# Patient Record
Sex: Female | Born: 1995 | Race: White | Hispanic: No | Marital: Single | Smoking: Never smoker
Health system: Southern US, Community
[De-identification: ages and names within clinical notes are randomized; demographics above are authoritative.]

## PROBLEM LIST (undated history)

## (undated) DIAGNOSIS — K219 Gastro-esophageal reflux disease without esophagitis: Secondary | ICD-10-CM

## (undated) DIAGNOSIS — J302 Other seasonal allergic rhinitis: Secondary | ICD-10-CM

## (undated) HISTORY — PX: NO PAST SURGERIES: SHX2092

---

## 2013-04-26 ENCOUNTER — Encounter (HOSPITAL_COMMUNITY): Payer: Self-pay | Admitting: Emergency Medicine

## 2013-04-26 ENCOUNTER — Inpatient Hospital Stay (HOSPITAL_COMMUNITY)
Admission: EM | Admit: 2013-04-26 | Discharge: 2013-04-29 | DRG: 690 | Disposition: A | Discharge status: Home / Self Care | Payer: BC Managed Care – PPO | Attending: Internal Medicine | Admitting: Internal Medicine

## 2013-04-26 ENCOUNTER — Emergency Department (HOSPITAL_COMMUNITY): Payer: BC Managed Care – PPO

## 2013-04-26 DIAGNOSIS — R6883 Chills (without fever): Secondary | ICD-10-CM | POA: Diagnosis present

## 2013-04-26 DIAGNOSIS — N12 Tubulo-interstitial nephritis, not specified as acute or chronic: Secondary | ICD-10-CM | POA: Insufficient documentation

## 2013-04-26 DIAGNOSIS — R509 Fever, unspecified: Secondary | ICD-10-CM | POA: Diagnosis present

## 2013-04-26 DIAGNOSIS — R112 Nausea with vomiting, unspecified: Secondary | ICD-10-CM | POA: Diagnosis present

## 2013-04-26 DIAGNOSIS — E876 Hypokalemia: Secondary | ICD-10-CM | POA: Diagnosis present

## 2013-04-26 DIAGNOSIS — R739 Hyperglycemia, unspecified: Secondary | ICD-10-CM | POA: Diagnosis present

## 2013-04-26 DIAGNOSIS — R1115 Cyclical vomiting syndrome unrelated to migraine: Secondary | ICD-10-CM | POA: Diagnosis present

## 2013-04-26 DIAGNOSIS — R7309 Other abnormal glucose: Secondary | ICD-10-CM

## 2013-04-26 DIAGNOSIS — R109 Unspecified abdominal pain: Secondary | ICD-10-CM | POA: Diagnosis present

## 2013-04-26 DIAGNOSIS — N10 Acute tubulo-interstitial nephritis: Principal | ICD-10-CM | POA: Diagnosis present

## 2013-04-26 DIAGNOSIS — K219 Gastro-esophageal reflux disease without esophagitis: Secondary | ICD-10-CM | POA: Diagnosis present

## 2013-04-26 HISTORY — DX: Other seasonal allergic rhinitis: J30.2

## 2013-04-26 HISTORY — DX: Gastro-esophageal reflux disease without esophagitis: K21.9

## 2013-04-26 LAB — COMPREHENSIVE METABOLIC PANEL
ALT: 12 U/L (ref 0–35)
AST: 12 U/L (ref 0–37)
Albumin: 3.5 g/dL (ref 3.5–5.2)
Alkaline Phosphatase: 65 U/L (ref 39–117)
BUN: 16 mg/dL (ref 6–23)
CO2: 21 mEq/L (ref 19–32)
Calcium: 9.7 mg/dL (ref 8.4–10.5)
Chloride: 99 mEq/L (ref 96–112)
Creatinine, Ser: 1.03 mg/dL (ref 0.50–1.10)
GFR calc Af Amer: >90 mL/min (ref >90)
GFR calc non Af Amer: 78 mL/min — ABNORMAL LOW (ref >90)
Glucose, Bld: 127 mg/dL — ABNORMAL HIGH (ref 70–99)
Potassium: 3.5 mEq/L — ABNORMAL LOW (ref 3.7–5.3)
Sodium: 134 mEq/L — ABNORMAL LOW (ref 137–147)
Total Bilirubin: 0.7 mg/dL (ref 0.3–1.2)
Total Protein: 7.6 g/dL (ref 6.0–8.3)

## 2013-04-26 LAB — URINE MICROSCOPIC-ADD ON

## 2013-04-26 LAB — URINALYSIS, ROUTINE W REFLEX MICROSCOPIC
Glucose, UA: NEGATIVE mg/dL
Hgb urine dipstick: NEGATIVE
Ketones, ur: 15 mg/dL — AB
Nitrite: NEGATIVE
Protein, ur: 100 mg/dL — AB
Specific Gravity, Urine: 1.029 (ref 1.005–1.030)
Urobilinogen, UA: 2 mg/dL — ABNORMAL HIGH (ref 0.0–1.0)
pH: 6 (ref 5.0–8.0)

## 2013-04-26 LAB — CBC WITH DIFFERENTIAL/PLATELET
Basophils Absolute: 0 10*3/uL (ref 0.0–0.1)
Basophils Relative: 0 % (ref 0–1)
Eosinophils Absolute: 0 10*3/uL (ref 0.0–0.7)
Eosinophils Relative: 0 % (ref 0–5)
HCT: 36 % (ref 36.0–46.0)
Hemoglobin: 12 g/dL (ref 12.0–15.0)
Lymphocytes Relative: 7 % — ABNORMAL LOW (ref 12–46)
Lymphs Abs: 2.2 10*3/uL (ref 0.7–4.0)
MCH: 28.8 pg (ref 26.0–34.0)
MCHC: 33.3 g/dL (ref 30.0–36.0)
MCV: 86.3 fL (ref 78.0–100.0)
Monocytes Absolute: 3.1 10*3/uL — ABNORMAL HIGH (ref 0.1–1.0)
Monocytes Relative: 10 % (ref 3–12)
Neutro Abs: 25.8 10*3/uL — ABNORMAL HIGH (ref 1.7–7.7)
Neutrophils Relative %: 83 % — ABNORMAL HIGH (ref 43–77)
Platelets: 261 10*3/uL (ref 150–400)
RBC: 4.17 MIL/uL (ref 3.87–5.11)
RDW: 14 % (ref 11.5–15.5)
WBC: 31.1 10*3/uL — ABNORMAL HIGH (ref 4.0–10.5)

## 2013-04-26 LAB — LIPASE, BLOOD: Lipase: 12 U/L (ref 11–59)

## 2013-04-26 LAB — POC URINE PREG, ED: Preg Test, Ur: NEGATIVE

## 2013-04-26 MED ORDER — ACETAMINOPHEN 325 MG PO TABS
650.0000 mg | ORAL_TABLET | Freq: Four times a day (QID) | ORAL | Status: DC | PRN
  Administered 2013-04-27: 650 mg via ORAL
  Filled 2013-04-26: qty 2

## 2013-04-26 MED ORDER — SODIUM CHLORIDE 0.9 % IV BOLUS (SEPSIS)
1000.0000 mL | INTRAVENOUS | Status: AC
  Administered 2013-04-26: 1000 mL via INTRAVENOUS

## 2013-04-26 MED ORDER — CEFTRIAXONE SODIUM 1 G IJ SOLR
1.0000 g | INTRAMUSCULAR | Status: DC
  Administered 2013-04-27 – 2013-04-28 (×2): 1 g via INTRAVENOUS
  Filled 2013-04-26 (×2): qty 10

## 2013-04-26 MED ORDER — IOHEXOL 300 MG/ML  SOLN
50.0000 mL | Freq: Once | INTRAMUSCULAR | Status: AC | PRN
  Administered 2013-04-26: 50 mL via ORAL

## 2013-04-26 MED ORDER — ACETAMINOPHEN 650 MG RE SUPP: 650.0000 mg | Freq: Four times a day (QID) | RECTAL | Status: DC | PRN

## 2013-04-26 MED ORDER — MORPHINE SULFATE 4 MG/ML IJ SOLN
4.0000 mg | Freq: Once | INTRAMUSCULAR | Status: AC
  Administered 2013-04-26: 4 mg via INTRAVENOUS

## 2013-04-26 MED ORDER — ONDANSETRON HCL 4 MG/2ML IJ SOLN
4.0000 mg | Freq: Four times a day (QID) | INTRAMUSCULAR | Status: DC
  Administered 2013-04-26 – 2013-04-28 (×4): 4 mg via INTRAVENOUS
  Filled 2013-04-26 (×4): qty 2

## 2013-04-26 MED ORDER — PROMETHAZINE HCL 25 MG/ML IJ SOLN: 12.5000 mg | Freq: Four times a day (QID) | INTRAMUSCULAR | Status: DC | PRN

## 2013-04-26 MED ORDER — PANTOPRAZOLE SODIUM 40 MG IV SOLR
40.0000 mg | INTRAVENOUS | Status: DC
  Administered 2013-04-26: 40 mg via INTRAVENOUS
  Filled 2013-04-26 (×2): qty 40

## 2013-04-26 MED ORDER — DEXTROSE 5 % IV SOLN
1.0000 g | Freq: Once | INTRAVENOUS | Status: AC
  Administered 2013-04-26: 1 g via INTRAVENOUS
  Filled 2013-04-26: qty 10

## 2013-04-26 MED ORDER — POTASSIUM CHLORIDE IN NACL 40-0.9 MEQ/L-% IV SOLN
INTRAVENOUS | Status: DC
  Administered 2013-04-26 – 2013-04-29 (×3): via INTRAVENOUS
  Filled 2013-04-26 (×7): qty 1000

## 2013-04-26 MED ORDER — ONDANSETRON HCL 4 MG PO TABS
4.0000 mg | ORAL_TABLET | Freq: Four times a day (QID) | ORAL | Status: DC
  Administered 2013-04-27 – 2013-04-29 (×5): 4 mg via ORAL
  Filled 2013-04-26 (×16): qty 1

## 2013-04-26 MED ORDER — MORPHINE SULFATE 4 MG/ML IJ SOLN
4.0000 mg | INTRAMUSCULAR | Status: DC | PRN
  Administered 2013-04-26: 4 mg via INTRAVENOUS
  Filled 2013-04-26: qty 1

## 2013-04-26 MED ORDER — ONDANSETRON HCL 4 MG/2ML IJ SOLN
4.0000 mg | Freq: Once | INTRAMUSCULAR | Status: AC
  Administered 2013-04-26: 4 mg via INTRAVENOUS
  Filled 2013-04-26: qty 2

## 2013-04-26 MED ORDER — MORPHINE SULFATE 4 MG/ML IJ SOLN
4.0000 mg | Freq: Once | INTRAMUSCULAR | Status: AC
  Administered 2013-04-26: 4 mg via INTRAVENOUS
  Filled 2013-04-26: qty 1

## 2013-04-26 MED ORDER — IOHEXOL 300 MG/ML  SOLN
100.0000 mL | Freq: Once | INTRAMUSCULAR | Status: AC | PRN
  Administered 2013-04-26: 100 mL via INTRAVENOUS

## 2013-04-26 NOTE — ED Provider Notes (Signed)
CSN: [id number]     Arrival date & time 04/26/13  1555 History   First MD Initiated Contact with Patient 04/26/13 1607     Chief Complaint  Patient presents with  . Emesis     (Consider location/radiation/quality/duration/timing/severity/associated sxs/prior Treatment) HPI Pt is a 17yo female [facility] due to 3 day hx of vomiting, lower abdominal pain with associated elevated WBC.  Pt states she has been unable to keep down fluids or food for past 2-3 days due to nausea and vomiting.  Pt also c/o lower abdominal pain that is constant, 8/10, aching and cramping, worse with certain movements.  Pt reports temp "above 100"  But took tylenol earlier today, 99.3 in triage.  Pt states she does have lower back pain but states it is from a "pulled muscle." Denies diarrhea, urinary or vaginal symptoms. Pt is sexually active, on birth control, states she is not concerned for pregnancy or STDs.  LMP was 3/26 but was "more spotting than normal."  Denies sick contacts or recent travel. Denies significant PMH including no previous abdominal surgeries.   Past Medical History  Diagnosis Date  . GERD (gastroesophageal reflux disease)    History reviewed. No pertinent past surgical history. No family history on file. History  Substance Use Topics  . Smoking status: Not on file  . Smokeless tobacco: Not on file  . Alcohol Use: Not on file   OB History   Grav Para Term Preterm Abortions TAB SAB Ect Mult Living                 Review of Systems  Constitutional: Positive for fever, chills, appetite change and fatigue. Negative for diaphoresis and unexpected weight change.  Gastrointestinal: Positive for nausea and vomiting. Negative for abdominal pain, diarrhea and constipation.  All other systems reviewed and are negative.      Allergies  Review of patient's allergies indicates no known allergies.  Home Medications   Current Outpatient Rx  Name  Route  Sig  Dispense  Refill   . acetaminophen (TYLENOL) 325 MG tablet   Oral   Take 650 mg by mouth every 6 (six) hours as needed (pain).         Marland Kitchen. aspirin-acetaminophen-caffeine (EXCEDRIN MIGRAINE) 250-250-65 MG per tablet   Oral   Take 1-2 tablets by mouth every 6 (six) hours as needed for headache (pain).         . cetirizine (ZYRTEC) 10 MG tablet   Oral   Take 10 mg by mouth daily.         Marland Kitchen. ibuprofen (ADVIL,MOTRIN) 200 MG tablet   Oral   Take 400 mg by mouth every 6 (six) hours as needed (pain).         . naproxen (NAPROSYN) 250 MG tablet   Oral   Take 500 mg by mouth 2 (two) times daily as needed (pain).         . norgestimate-ethinyl estradiol (SPRINTEC 28) 0.25-35 MG-MCG tablet   Oral   Take 1 tablet by mouth daily.          BP 109/61  Pulse 85  Temp(Src) 99.3 F (37.4 C) (Oral)  Resp 12  Ht 5\' 2"  (1.575 m)  Wt 132 lb 12.8 oz (60.238 kg)  BMI 24.28 kg/m2  SpO2 99%  LMP 04/24/2013 Physical Exam  Nursing note and vitals reviewed. Constitutional: She appears well-developed and well-nourished. No distress.  HENT:  Head: Normocephalic and atraumatic.  Eyes: Conjunctivae  are normal. No scleral icterus.  Neck: Normal range of motion.  Cardiovascular: Normal rate, regular rhythm and normal heart sounds.   Pulmonary/Chest: Effort normal and breath sounds normal. No respiratory distress. She has no wheezes. She has no rales. She exhibits no tenderness.  Abdominal: Soft. Bowel sounds are normal. She exhibits no distension and no mass. There is tenderness. There is CVA tenderness ( bilateral CVAT vs muscularge ). There is no rebound and no guarding.  Soft, non-distended, lower abdominal pain, L>R. No rebound, guarding or masses.  Genitourinary: Vagina normal.  Chaperoned exam. Normal external exam. Speculum: no vaginal discharge, erythema or lesions. Bimanual: No CMT, adnexal tenderness or masses.  Musculoskeletal: Normal range of motion.  Neurological: She is alert.  Skin: Skin is warm  and dry. She is not diaphoretic.    ED Course  Procedures (including critical care time) Labs Review Labs Reviewed  CBC WITH DIFFERENTIAL - Abnormal; Notable for the following:    WBC 31.1 (*)    Neutrophils Relative % 83 (*)    Lymphocytes Relative 7 (*)    Neutro Abs 25.8 (*)    Monocytes Absolute 3.1 (*)    All other components within normal limits  COMPREHENSIVE METABOLIC PANEL - Abnormal; Notable for the following:    Sodium 134 (*)    Potassium 3.5 (*)    Glucose, Bld 127 (*)    GFR calc non Af Amer 78 (*)    All other components within normal limits  URINALYSIS, ROUTINE W REFLEX MICROSCOPIC - Abnormal; Notable for the following:    Color, Urine AMBER (*)    APPearance CLOUDY (*)    Bilirubin Urine SMALL (*)    Ketones, ur 15 (*)    Protein, ur 100 (*)    Urobilinogen, UA 2.0 (*)    Leukocytes, UA SMALL (*)    All other components within normal limits  URINE MICROSCOPIC-ADD ON - Abnormal; Notable for the following:    Squamous Epithelial / LPF FEW (*)    Bacteria, UA MANY (*)    Casts HYALINE CASTS (*)    All other components within normal limits  LIPASE, BLOOD  POC URINE PREG, ED   Imaging Review Ct Abdomen Pelvis W Contrast  04/26/2013   CLINICAL DATA:  Mid abdominal/left flank pain, leukocytosis  EXAM: CT ABDOMEN AND PELVIS WITH CONTRAST  TECHNIQUE: Multidetector CT imaging of the abdomen and pelvis was performed using the standard protocol following bolus administration of intravenous contrast.  CONTRAST:  OMNIPAQUE IOHEXOL 300 MG/ML  SOLN  COMPARISON:  None.  FINDINGS: Lung bases are clear.  Liver, spleen, pancreas, and adrenal glands are within normal limits.  Gallbladder is unremarkable. No intrahepatic or extrahepatic ductal dilatation.  Heterogeneous perfusion of the left kidney (series 2/ image 23), suggesting pyelonephritis. Right kidney is within normal limits. No hydronephrosis.  No evidence of bowel obstruction. Appendix is not discretely visualized but  there are no right lower quadrant inflammatory changes to suggest acute appendicitis.  No evidence of abdominal aortic aneurysm.  No abdominopelvic ascites.  No suspicious abdominopelvic lymphadenopathy.  Uterus and left ovary are within normal limits. No right adnexal mass.  Thick-walled bladder (series 2/ image 68).  Visualized osseous structures are within normal limits.  IMPRESSION: Thick-walled bladder, correlate for cystitis.  Associated heterogeneous perfusion of the left kidney, suggesting pyelonephritis.   Electronically Signed   By: Charline Bills M.D.   On: 04/26/2013 19:42     EKG Interpretation None  MDM   Final diagnoses:  Pyelonephritis    Pt is a 17yo healthy female sent to ED from [facility] c/o vomiting x2-3 days associated with fever, abdominal pain, and elevated WBC. Pt is otherwise healthy.  On exam-pt appears well, non-toxic. Vitals: unremarkable. Abd-soft, non-distended, tender across lower abdomen.  Pelvic: benign.  DDx: ectopic pregnancy, diverticulitis, appendicitis, vaginal infection, UTI/pyelonephritis, gastroenteritis.   Tx in ED: IV fluids, zofran, and morphine  Labs- CBC: WBC-31.1           CMP: mild hypokalemia- 3.5            Lipase: WNL             UA-evidence of UTI            Urine preg: negative  Imaging: CT abd pelvis: suggestive of pyelonephritis and cystitis.    Discussed pt with Dr. Denton Lank. Due to evidence of pyelonephritis, hx of fever, vomiting and pain, will consult hospitalist to admit pt for IV antibiotics.  IV rocephin started in ED.   8:07 PM Consulted with Dr. Sherrie Mustache, [facility] hospitalist. Pt will be admitted to med-surg. Pt is stable at this time and requesting to eat.       Junius Finner, PA-C 04/26/13 2008

## 2013-04-26 NOTE — H&P (Signed)
[facility] History and Physical  Taralynn Quiett AVW:[id number] DOB: [birth year] DOA: 04/26/2013  Referring physician: PA, Carolin Coy PCP: No primary provider on file.   Chief Complaint: Nausea and vomiting.  HPI: Cynthia Gross is a 18 y.o. female with a history of seasonal allergies and GERD, who presents with a complaint of nausea, vomiting, and lower abdominal pain. Her symptoms started 2 days ago. They have persisted and worsened. On average, she has had more than 5 episodes of emesis daily. She has been unable to keep any of her medications and solids and liquids down. She has had pain over her lower abdomen, over her bladder, which radiates to her left lower back. She describes the pain as if she had a "pulled muscle". At its worse, the pain as a 7-8/10 in intensity. She has discomfort with urination, but she denies burning with urination. She has had subjective fever and chills. She had been trying to take Tylenol for aches and fever, but had vomited it back up. She denies coffee grounds emesis, blood in her emesis, blood in her stools, or diarrhea. She presented to [facility] earlier today because of her persistent symptoms. Apparently, they obtained blood work and told her that her white blood cell count was too high and to come to the emergency department.  In the emergency department, the patient is afebrile and hemodynamically stable. Her lab data are significant for white blood cell count of 31.1, sodium of 134, potassium of 3.5, normal lipase and liver transaminases and glucose of 127. Urine pregnancy test is negative. Urinalysis reveals 11-20 WBCs, many bacteria, negative nitrite. CT of the abdomen and pelvis reveals thickwalled bladder and associated heterogeneous perfusion of the left kidney, suggesting pyelonephritis. She is being admitted for further evaluation and management.    Review of Systems:  As above in history present illness. In addition, she has occasional  rhinitis, sneezing, and coughing from seasonal allergies. Otherwise review of systems is negative.  Past Medical History  Diagnosis Date  . GERD (gastroesophageal reflux disease)   . Seasonal allergies    Past Surgical History  Procedure Laterality Date  . No past surgeries     Social History: She is a Medical laboratory scientific officer at [employer]. She is single. She has no children. She denies tobacco use. She drinks a mixed drink once a month. She denies illicit drug use.   No Known Allergies  Family history: Her mother is 59 years old and is healthy. Her father is 46 years of age and has a history of GERD and Barrett's esophagitis.  Prior to Admission medications   Medication Sig Start Date End Date Taking? Authorizing Provider  acetaminophen (TYLENOL) 325 MG tablet Take 650 mg by mouth every 6 (six) hours as needed (pain).   Yes Historical Provider, MD  aspirin-acetaminophen-caffeine (EXCEDRIN MIGRAINE) [id number] MG per tablet Take 1-2 tablets by mouth every 6 (six) hours as needed for headache (pain).   Yes Historical Provider, MD  cetirizine (ZYRTEC) 10 MG tablet Take 10 mg by mouth daily.   Yes Historical Provider, MD  ibuprofen (ADVIL,MOTRIN) 200 MG tablet Take 400 mg by mouth every 6 (six) hours as needed (pain).   Yes Historical Provider, MD  naproxen (NAPROSYN) 250 MG tablet Take 500 mg by mouth 2 (two) times daily as needed (pain).   Yes Historical Provider, MD  norgestimate-ethinyl estradiol (SPRINTEC 28) 0.25-35 MG-MCG tablet Take 1 tablet by mouth daily.   Yes Historical Provider, MD   Physical Exam: Filed Vitals:  04/26/13 2111  BP: 101/57  Pulse: 89  Temp: 98.4 F (36.9 C)  Resp: 16    BP 101/57  Pulse 89  Temp(Src) 98.4 F (36.9 C) (Oral)  Resp 16  Ht 5\' 2"  (1.575 m)  Wt 60.238 kg (132 lb 12.8 oz)  BMI 24.28 kg/m2  SpO2 99%  LMP 04/24/2013  General:  Appears calm and comfortable Eyes: PERRL, normal lids, irises & conjunctiva ENT: grossly normal hearing, lips & tongue Neck:  no LAD, masses or thyromegaly Cardiovascular: RRR, no m/r/g. No LE edema. Telemetry: SR, no arrhythmias  Respiratory: CTA bilaterally, no w/r/r. Normal respiratory effort. Abdomen: Positive bowel sounds, soft, mildly to moderately tender over the bladder and left CVA. No rigidity, guarding, or distention. No masses palpated.  Skin: no  soft, positive bowel sounds,rash or induration seen on limited exam Musculoskeletal: grossly normal tone BUE/BLE Psychiatric: grossly normal mood and affect, speech fluent and appropriate Neurologic: grossly non-focal.          Labs on Admission:  Basic Metabolic Panel:  Recent Labs Lab 04/26/13 1630  NA 134*  K 3.5*  CL 99  CO2 21  GLUCOSE 127*  BUN 16  CREATININE 1.03  CALCIUM 9.7   Liver Function Tests:  Recent Labs Lab 04/26/13 1630  AST 12  ALT 12  ALKPHOS 65  BILITOT 0.7  PROT 7.6  ALBUMIN 3.5    Recent Labs Lab 04/26/13 1630  LIPASE 12   No results found for this basename: AMMONIA,  in the last 168 hours CBC:  Recent Labs Lab 04/26/13 1630  WBC 31.1*  NEUTROABS 25.8*  HGB 12.0  HCT 36.0  MCV 86.3  PLT 261   Cardiac Enzymes: No results found for this basename: CKTOTAL, CKMB, CKMBINDEX, TROPONINI,  in the last 168 hours  BNP (last 3 results) No results found for this basename: PROBNP,  in the last 8760 hours CBG: No results found for this basename: GLUCAP,  in the last 168 hours  Radiological Exams on Admission: Ct Abdomen Pelvis W Contrast  04/26/2013   CLINICAL DATA:  Mid abdominal/left flank pain, leukocytosis  EXAM: CT ABDOMEN AND PELVIS WITH CONTRAST  TECHNIQUE: Multidetector CT imaging of the abdomen and pelvis was performed using the standard protocol following bolus administration of intravenous contrast.  CONTRAST:  100mL OMNIPAQUE IOHEXOL 300 MG/ML  SOLN  COMPARISON:  None.  FINDINGS: Lung bases are clear.  Liver, spleen, pancreas, and adrenal glands are within normal limits.  Gallbladder is  unremarkable. No intrahepatic or extrahepatic ductal dilatation.  Heterogeneous perfusion of the left kidney (series 2/ image 23), suggesting pyelonephritis. Right kidney is within normal limits. No hydronephrosis.  No evidence of bowel obstruction. Appendix is not discretely visualized but there are no right lower quadrant inflammatory changes to suggest acute appendicitis.  No evidence of abdominal aortic aneurysm.  No abdominopelvic ascites.  No suspicious abdominopelvic lymphadenopathy.  Uterus and left ovary are within normal limits. No right adnexal mass.  Thick-walled bladder (series 2/ image 68).  Visualized osseous structures are within normal limits.  IMPRESSION: Thick-walled bladder, correlate for cystitis.  Associated heterogeneous perfusion of the left kidney, suggesting pyelonephritis.   Electronically Signed   By: Charline BillsSriyesh  Krishnan M.D.   On: 04/26/2013 19:42    EKG: not done   Assessment/Plan Principal Problem:   Acute pyelonephritis Active Problems:   Nausea and vomiting   Hypokalemia   Hyperglycemia   1. This is a 18 year old with acute pyelonephritis. She has had fever, chills, impressive  leukocytosis, and persistent nausea and vomiting, but she was able to sip Gatorade in the emergency department without vomiting. She has symptomatic but mild to moderate lower abdominal pain and left CVA tenderness, consistent with the results of the CT scan.     Plan: 1. The patient received Rocephin in the emergency department. This will be continued. Urine culture has been ordered. 2. Will start Zofran scheduled every 6 hours and add when necessary Phenergan. 3. We'll start Protonix IV empirically. 4. Will hydrate with normal saline. We'll at 40 mEq of potassium chloride to treat hypokalemia. Follow her serum potassium daily and modify IV fluids accordingly. 5. We'll order a hemoglobin A1c for mild hyperglycemia, although, the patient has no history of diabetes mellitus. 6. We'll start  a clear liquid diet as tolerated. The patient was instructed to start slow and to sip on liquids as tolerated and to not rush it. She voiced understanding.    Code Status: Full code  Family Communication: discussed with the patient's mother  Disposition Plan: anticipate length of stay 48-72 hours.   Time spent:  1 hour   [facility] Pager [phone]

## 2013-04-26 NOTE — ED Notes (Signed)
Pt sent from [facility]. Pt has been vomiting for the past 3 days. Unable to keep anything down. Pt c/o headache and lower abdominal pain. Pt sent to ED due to elevated WBC count and vomiting. Pt alert, no acute distress. Skin warm dry. Pt had fever earlier and was given Tylenol PTA.

## 2013-04-27 DIAGNOSIS — N12 Tubulo-interstitial nephritis, not specified as acute or chronic: Secondary | ICD-10-CM

## 2013-04-27 LAB — CBC
HCT: 33.2 % — ABNORMAL LOW (ref 36.0–46.0)
Hemoglobin: 10.7 g/dL — ABNORMAL LOW (ref 12.0–15.0)
MCH: 28.3 pg (ref 26.0–34.0)
MCHC: 32.2 g/dL (ref 30.0–36.0)
MCV: 87.8 fL (ref 78.0–100.0)
PLATELETS: 232 10*3/uL (ref 150–400)
RBC: 3.78 MIL/uL — AB (ref 3.87–5.11)
RDW: 14.1 % (ref 11.5–15.5)
WBC: 26.5 10*3/uL — ABNORMAL HIGH (ref 4.0–10.5)

## 2013-04-27 LAB — BASIC METABOLIC PANEL
BUN: 9 mg/dL (ref 6–23)
CALCIUM: 8.6 mg/dL (ref 8.4–10.5)
CO2: 21 meq/L (ref 19–32)
Chloride: 99 mEq/L (ref 96–112)
Creatinine, Ser: 0.87 mg/dL (ref 0.50–1.10)
GFR calc Af Amer: >90 mL/min (ref >90)
GFR calc non Af Amer: >90 mL/min (ref >90)
GLUCOSE: 116 mg/dL — AB (ref 70–99)
Potassium: 3.7 mEq/L (ref 3.7–5.3)
Sodium: 131 mEq/L — ABNORMAL LOW (ref 137–147)

## 2013-04-27 LAB — HEMOGLOBIN A1C
Hgb A1c MFr Bld: 5.3 % (ref <5.7)
Mean Plasma Glucose: 105 mg/dL (ref <117)

## 2013-04-27 MED ORDER — LORATADINE 10 MG PO TABS
10.0000 mg | ORAL_TABLET | Freq: Every day | ORAL | Status: DC
  Filled 2013-04-27: qty 1

## 2013-04-27 MED ORDER — HYDROMORPHONE HCL PF 1 MG/ML IJ SOLN
0.5000 mg | INTRAMUSCULAR | Status: DC | PRN
  Administered 2013-04-27: 0.5 mg via INTRAVENOUS
  Administered 2013-04-28: 1 mg via INTRAVENOUS
  Filled 2013-04-27 (×2): qty 1

## 2013-04-27 MED ORDER — HYDROMORPHONE HCL PF 1 MG/ML IJ SOLN
1.0000 mg | INTRAMUSCULAR | Status: DC | PRN
  Administered 2013-04-27 (×3): 1 mg via INTRAVENOUS
  Filled 2013-04-27 (×3): qty 1

## 2013-04-27 MED ORDER — HYDROMORPHONE HCL PF 1 MG/ML IJ SOLN
0.5000 mg | INTRAMUSCULAR | Status: DC | PRN
  Administered 2013-04-27: 0.5 mg via INTRAVENOUS
  Filled 2013-04-27: qty 1

## 2013-04-27 MED ORDER — PANTOPRAZOLE SODIUM 40 MG PO TBEC
40.0000 mg | DELAYED_RELEASE_TABLET | Freq: Every day | ORAL | Status: DC
  Administered 2013-04-27: 40 mg via ORAL
  Filled 2013-04-27 (×3): qty 1

## 2013-04-27 MED ORDER — CETIRIZINE HCL 10 MG PO TABS
10.0000 mg | ORAL_TABLET | Freq: Every day | ORAL | Status: DC
  Administered 2013-04-27 – 2013-04-28 (×2): 10 mg via ORAL
  Filled 2013-04-27 (×3): qty 1

## 2013-04-27 MED ORDER — OXYCODONE HCL 5 MG PO TABS
5.0000 mg | ORAL_TABLET | ORAL | Status: DC | PRN
  Administered 2013-04-27: 5 mg via ORAL
  Filled 2013-04-27 (×2): qty 1

## 2013-04-27 MED ORDER — DIPHENHYDRAMINE HCL 25 MG PO CAPS
25.0000 mg | ORAL_CAPSULE | Freq: Four times a day (QID) | ORAL | Status: DC | PRN
  Administered 2013-04-27: 25 mg via ORAL
  Filled 2013-04-27: qty 1

## 2013-04-27 MED ORDER — NON FORMULARY
10.0000 mg | Freq: Every day | Status: DC
Start: 2013-04-27 — End: 2013-04-27

## 2013-04-27 NOTE — Progress Notes (Signed)
PHARMACY BRIEF NOTE:  PROTONIX IV TO PO CONVERSION  This patient is receiving IV Protonix, which is in critically short supply.    Based on emergency conservation measures implemented by the Pharmacy and Therapeutics Committee chairman in consultation with gastroenterology, this patient now meets criteria for automatic switch to PO Protonix; the medication profile has been updated accordingly. Patient currently taking other PO meds and started on regular diet.   If there are questions regarding this change, please contact Pharmacy at [phone]  Thank you,   Juliette Alcideustin Macala Baldonado, PharmD, BCPS.   Pager: [phone] 04/27/2013 12:40 PM

## 2013-04-27 NOTE — Progress Notes (Signed)
[facility] PROGRESS NOTE  Cynthia Gross RUE:[id number] DOB: [birth year] DOA: 04/26/2013 PCP: No primary provider on file.  Assessment/Plan: Principal Problem:  Acute pyelonephritis  Active Problems:  Nausea and vomiting  Hypokalemia  Hyperglycemia  18 y.o. female with a history of seasonal allergies and GERD, who presents with a complaint of nausea, vomiting, and lower abdominal pain found to have UTI, pyelonephritis  1.  UTI, pyelonephritis -cont atx, ordered urine culture  2. Leukocytosis due to above; cont atx; recheck in AM   3. Nausea, vomiting likely due to pyelonephritis  -abd exam unremarkable; CT abd no acute findings, except pyelonephritis;  diet if tolerated      Code Status: full Family Communication: d/w patient, her mother (indicate person spoken with, relationship, and if by phone, the number) Disposition Plan: home 24-48 hours    Consultants:  none  Procedures:  None   Antibiotics:  Ceftriaxone 4/1<<< (indicate start date, and stop date if known)  HPI/Subjective: alert  Objective: Filed Vitals:   04/27/13 0505  BP: 105/57  Pulse: 95  Temp: 99 F (37.2 C)  Resp: 18    Intake/Output Summary (Last 24 hours) at 04/27/13 1149 Last data filed at 04/27/13 1045  Gross per 24 hour  Intake    200 ml  Output   1350 ml  Net  -1150 ml   Filed Weights   04/26/13 1603  Weight: 60.238 kg (132 lb 12.8 oz)    Exam:   General:  alert  Cardiovascular: s1,s2 rrr  Respiratory: CTA BL  Abdomen: soft, nt,nd   Musculoskeletal: no LE edema   Data Reviewed: Basic Metabolic Panel:  Recent Labs Lab 04/26/13 1630 04/27/13 0420  NA 134* 131*  K 3.5* 3.7  CL 99 99  CO2 21 21  GLUCOSE 127* 116*  BUN 16 9  CREATININE 1.03 0.87  CALCIUM 9.7 8.6   Liver Function Tests:  Recent Labs Lab 04/26/13 1630  AST 12  ALT 12  ALKPHOS 65  BILITOT 0.7  PROT 7.6  ALBUMIN 3.5    Recent Labs Lab 04/26/13 1630  LIPASE 12   No results  found for this basename: AMMONIA,  in the last 168 hours CBC:  Recent Labs Lab 04/26/13 1630 04/27/13 0420  WBC 31.1* 26.5*  NEUTROABS 25.8*  --   HGB 12.0 10.7*  HCT 36.0 33.2*  MCV 86.3 87.8  PLT 261 232   Cardiac Enzymes: No results found for this basename: CKTOTAL, CKMB, CKMBINDEX, TROPONINI,  in the last 168 hours BNP (last 3 results) No results found for this basename: PROBNP,  in the last 8760 hours CBG: No results found for this basename: GLUCAP,  in the last 168 hours  No results found for this or any previous visit (from the past 240 hour(s)).   Studies: Ct Abdomen Pelvis W Contrast  04/26/2013   CLINICAL DATA:  Mid abdominal/left flank pain, leukocytosis  EXAM: CT ABDOMEN AND PELVIS WITH CONTRAST  TECHNIQUE: Multidetector CT imaging of the abdomen and pelvis was performed using the standard protocol following bolus administration of intravenous contrast.  CONTRAST:  OMNIPAQUE IOHEXOL 300 MG/ML  SOLN  COMPARISON:  None.  FINDINGS: Lung bases are clear.  Liver, spleen, pancreas, and adrenal glands are within normal limits.  Gallbladder is unremarkable. No intrahepatic or extrahepatic ductal dilatation.  Heterogeneous perfusion of the left kidney (series 2/ image 23), suggesting pyelonephritis. Right kidney is within normal limits. No hydronephrosis.  No evidence of bowel obstruction. Appendix is not discretely visualized  but there are no right lower quadrant inflammatory changes to suggest acute appendicitis.  No evidence of abdominal aortic aneurysm.  No abdominopelvic ascites.  No suspicious abdominopelvic lymphadenopathy.  Uterus and left ovary are within normal limits. No right adnexal mass.  Thick-walled bladder (series 2/ image 68).  Visualized osseous structures are within normal limits.  IMPRESSION: Thick-walled bladder, correlate for cystitis.  Associated heterogeneous perfusion of the left kidney, suggesting pyelonephritis.   Electronically Signed   By: Charline BillsSriyesh   Krishnan M.D.   On: 04/26/2013 19:42    Scheduled Meds: . cefTRIAXone (ROCEPHIN)  IV  1 g Intravenous Q24H  . ondansetron  4 mg Oral Q6H   Or  . ondansetron (ZOFRAN) IV  4 mg Intravenous Q6H  . pantoprazole (PROTONIX) IV  40 mg Intravenous Q24H   Continuous Infusions: . 0.9 % NaCl with KCl 40 mEq / L 125 mL/hr at 04/26/13 2138    Principal Problem:   Acute pyelonephritis Active Problems:   Nausea and vomiting   Hypokalemia   Hyperglycemia    Time spent: >35 minutes     Esperanza SheetsBURIEV, Aparna Vanderweele N  [facility] Pager [phone]. If 7PM-7AM, please contact night-coverage at [email or web address], password [email or web address] 04/27/2013, 11:49 AM  LOS: 1 day

## 2013-04-27 NOTE — Progress Notes (Signed)
Pt complaining of IV site burning after IV fluids were started containing potassium. Also pt continuing to complain of pain rating it a 6/10 which had gone down from 9/10. MD on call notified. New orders received for pain medicine. No new orders received for IVF. Pt given warm compress to hand and pt says the pain is "some better" but has not gone away. Pt received Oxycodone 5 mg and began itching. MD on call notified new orders received. Pt given Benadryl and reports that itching is better.

## 2013-04-27 NOTE — Progress Notes (Signed)
INITIAL NUTRITION ASSESSMENT  DOCUMENTATION CODES Per approved criteria  -Not Applicable   INTERVENTION: - Diet advancement per MD - Discussed diet therapy for nausea/vomiting - Will continue to monitor   NUTRITION DIAGNOSIS: Inadequate oral intake related to clear liquid diet as evidenced by diet order.   Goal: Advance diet as tolerated to regular diet  Monitor:  Weights, labs, diet advancement, nausea/vomiting   Reason for Assessment: Malnutrition screening tool   18 y.o. female  Admitting Dx: Acute pyelonephritis  ASSESSMENT: Pt with history of seasonal allergies and GERD, who presents with a complaint of nausea, vomiting, and lower abdominal pain. Her symptoms started 2 days ago. They have persisted and worsened. On average, she has had more than 5 episodes of emesis daily. She has been unable to keep any of her medications and solids and liquids down. She has had pain over her lower abdomen, over her bladder, which radiates to her left lower back. Found to have acute pyelonephritis.   Met with pt and mother who report pt eating well before this. Pt reports having problems with her blood sugar usually being too low so she is eating all the time. Thinks she's lost 8 pounds in the past month from all the vomiting she had since Monday night. Reports abdominal pain is better. Has been able to keep down jello, broth, and hot tea. Denies any nausea/vomiting today.    Height: Ht Readings from Last 1 Encounters:  04/26/13 _0  (1.575 m) (19%*, Z = -0.90)   * Growth percentiles are based on CDC 2-20 Years data.    Weight: Wt Readings from Last 1 Encounters:  04/26/13 132 lb 12.8 oz (60.238 kg) (59%*, Z = 0.23)   * Growth percentiles are based on CDC 2-20 Years data.    Ideal Body Weight: 110 lbs  % Ideal Body Weight: 120%  Wt Readings from Last 10 Encounters:  04/26/13 132 lb 12.8 oz (60.238 kg) (59%*, Z = 0.23)   * Growth percentiles are based on CDC 2-20 Years data.     Usual Body Weight: 140 lbs per pt  % Usual Body Weight: 94%  BMI:  Body mass index is 24.28 kg/(m^2).  Estimated Nutritional Needs: Kcal: 1500-1700 Protein: 60-75g Fluid: 1.5-1.7L/day  Skin: WNL  Diet Order: Clear Liquid  EDUCATION NEEDS: -No education needs identified at this time   Intake/Output Summary (Last 24 hours) at 04/27/13 1126 Last data filed at 04/27/13 1045  Gross per 24 hour  Intake    200 ml  Output   1350 ml  Net  -1150 ml    Last BM: 4/1  Labs:   Recent Labs Lab 04/26/13 1630 04/27/13 0420  NA 134* 131*  K 3.5* 3.7  CL 99 99  CO2 21 21  BUN 16 9  CREATININE 1.03 0.87  CALCIUM 9.7 8.6  GLUCOSE 127* 116*    CBG (last 3)  No results found for this basename: GLUCAP,  in the last 72 hours  Scheduled Meds: . cefTRIAXone (ROCEPHIN)  IV  1 g Intravenous Q24H  . ondansetron  4 mg Oral Q6H   Or  . ondansetron (ZOFRAN) IV  4 mg Intravenous Q6H  . pantoprazole (PROTONIX) IV  40 mg Intravenous Q24H    Continuous Infusions: . 0.9 % NaCl with KCl 40 mEq / L 125 mL/hr at 04/26/13 2138    Past Medical History  Diagnosis Date  . GERD (gastroesophageal reflux disease)   . Seasonal allergies     Past  Surgical History  Procedure Laterality Date  . No past surgeries      Mikey College MS, RD, [place] Pager [phone] After Hours Pager

## 2013-04-28 LAB — URINE CULTURE
Colony Count: NO GROWTH
Culture: NO GROWTH

## 2013-04-28 LAB — BASIC METABOLIC PANEL
BUN: 7 mg/dL (ref 6–23)
CHLORIDE: 103 meq/L (ref 96–112)
CO2: 21 mEq/L (ref 19–32)
Calcium: 8.7 mg/dL (ref 8.4–10.5)
Creatinine, Ser: 0.8 mg/dL (ref 0.50–1.10)
GFR calc non Af Amer: >90 mL/min (ref >90)
Glucose, Bld: 123 mg/dL — ABNORMAL HIGH (ref 70–99)
POTASSIUM: 4.2 meq/L (ref 3.7–5.3)
Sodium: 134 mEq/L — ABNORMAL LOW (ref 137–147)

## 2013-04-28 LAB — CBC
HCT: 31.7 % — ABNORMAL LOW (ref 36.0–46.0)
HEMOGLOBIN: 10.5 g/dL — AB (ref 12.0–15.0)
MCH: 28.8 pg (ref 26.0–34.0)
MCHC: 33.1 g/dL (ref 30.0–36.0)
MCV: 86.8 fL (ref 78.0–100.0)
Platelets: 278 10*3/uL (ref 150–400)
RBC: 3.65 MIL/uL — ABNORMAL LOW (ref 3.87–5.11)
RDW: 14.2 % (ref 11.5–15.5)
WBC: 26 10*3/uL — AB (ref 4.0–10.5)

## 2013-04-28 MED ORDER — KETOROLAC TROMETHAMINE 15 MG/ML IJ SOLN
15.0000 mg | Freq: Four times a day (QID) | INTRAMUSCULAR | Status: DC | PRN
  Administered 2013-04-28 (×2): 15 mg via INTRAVENOUS
  Filled 2013-04-28 (×2): qty 1

## 2013-04-28 MED ORDER — HYDROCODONE-ACETAMINOPHEN 5-325 MG PO TABS
1.0000 | ORAL_TABLET | Freq: Four times a day (QID) | ORAL | Status: DC | PRN
  Administered 2013-04-28 – 2013-04-29 (×3): 1 via ORAL
  Filled 2013-04-28 (×3): qty 1

## 2013-04-28 NOTE — Progress Notes (Signed)
[facility] PROGRESS NOTE  Cynthia Langoshlyn Narvaez XBJ:[id number] DOB: 08/04/1995 DOA: 04/26/2013 PCP: No primary provider on file.  Assessment/Plan: Principal Problem:  Acute pyelonephritis  Active Problems:  Nausea and vomiting  Hypokalemia  Hyperglycemia  18 y.o. female with a history of seasonal allergies and GERD, who presents with a complaint of nausea, vomiting, and lower abdominal pain found to have UTI, pyelonephritis  1.  UTI, pyelonephritis; afebrile  -cont atx, ordered urine culture/pend   2. Leukocytosis due to above; cont atx; recheck in AM   3. Nausea, vomiting likely due to pyelonephritis  -abd exam unremarkable; CT abd no acute findings, except pyelonephritis;  diet if tolerated      Code Status: full Family Communication: d/w patient, her mother (indicate person spoken with, relationship, and if by phone, the number) Disposition Plan: home 24-48 hours    Consultants:  none  Procedures:  None   Antibiotics:  Ceftriaxone 4/1<<< (indicate start date, and stop date if known)  HPI/Subjective: alert  Objective: Filed Vitals:   04/28/13 0702  BP: 90/66  Pulse: 69  Temp: 98 F (36.7 C)  Resp: 18    Intake/Output Summary (Last 24 hours) at 04/28/13 0959 Last data filed at 04/28/13 0600  Gross per 24 hour  Intake   2220 ml  Output   2200 ml  Net     20 ml   Filed Weights   04/26/13 1603  Weight: 60.238 kg (132 lb 12.8 oz)    Exam:   General:  alert  Cardiovascular: s1,s2 rrr  Respiratory: CTA BL  Abdomen: soft, nt,nd   Musculoskeletal: no LE edema   Data Reviewed: Basic Metabolic Panel:  Recent Labs Lab 04/26/13 1630 04/27/13 0420 04/28/13 0415  NA 134* 131* 134*  K 3.5* 3.7 4.2  CL 99 99 103  CO2 21 21 21   GLUCOSE 127* 116* 123*  BUN 16 9 7   CREATININE 1.03 0.87 0.80  CALCIUM 9.7 8.6 8.7   Liver Function Tests:  Recent Labs Lab 04/26/13 1630  AST 12  ALT 12  ALKPHOS 65  BILITOT 0.7  PROT 7.6  ALBUMIN 3.5     Recent Labs Lab 04/26/13 1630  LIPASE 12   No results found for this basename: AMMONIA,  in the last 168 hours CBC:  Recent Labs Lab 04/26/13 1630 04/27/13 0420 04/28/13 0415  WBC 31.1* 26.5* 26.0*  NEUTROABS 25.8*  --   --   HGB 12.0 10.7* 10.5*  HCT 36.0 33.2* 31.7*  MCV 86.3 87.8 86.8  PLT 261 232 278   Cardiac Enzymes: No results found for this basename: CKTOTAL, CKMB, CKMBINDEX, TROPONINI,  in the last 168 hours BNP (last 3 results) No results found for this basename: PROBNP,  in the last 8760 hours CBG: No results found for this basename: GLUCAP,  in the last 168 hours  No results found for this or any previous visit (from the past 240 hour(s)).   Studies: Ct Abdomen Pelvis W Contrast  04/26/2013   CLINICAL DATA:  Mid abdominal/left flank pain, leukocytosis  EXAM: CT ABDOMEN AND PELVIS WITH CONTRAST  TECHNIQUE: Multidetector CT imaging of the abdomen and pelvis was performed using the standard protocol following bolus administration of intravenous contrast.  CONTRAST:  100mL OMNIPAQUE IOHEXOL 300 MG/ML  SOLN  COMPARISON:  None.  FINDINGS: Lung bases are clear.  Liver, spleen, pancreas, and adrenal glands are within normal limits.  Gallbladder is unremarkable. No intrahepatic or extrahepatic ductal dilatation.  Heterogeneous perfusion of the left kidney (  series 2/ image 23), suggesting pyelonephritis. Right kidney is within normal limits. No hydronephrosis.  No evidence of bowel obstruction. Appendix is not discretely visualized but there are no right lower quadrant inflammatory changes to suggest acute appendicitis.  No evidence of abdominal aortic aneurysm.  No abdominopelvic ascites.  No suspicious abdominopelvic lymphadenopathy.  Uterus and left ovary are within normal limits. No right adnexal mass.  Thick-walled bladder (series 2/ image 68).  Visualized osseous structures are within normal limits.  IMPRESSION: Thick-walled bladder, correlate for cystitis.  Associated  heterogeneous perfusion of the left kidney, suggesting pyelonephritis.   Electronically Signed   By: Charline Bills M.D.   On: 04/26/2013 19:42    Scheduled Meds: . cefTRIAXone (ROCEPHIN)  IV  1 g Intravenous Q24H  . cetirizine  10 mg Oral Daily  . ondansetron  4 mg Oral Q6H   Or  . ondansetron (ZOFRAN) IV  4 mg Intravenous Q6H  . pantoprazole  40 mg Oral Daily   Continuous Infusions: . 0.9 % NaCl with KCl 40 mEq / L 125 mL/hr at 04/27/13 1731    Principal Problem:   Acute pyelonephritis Active Problems:   Nausea and vomiting   Hypokalemia   Hyperglycemia    Time spent: >35 minutes     Esperanza Sheets  [facility] Pager [phone]. If 7PM-7AM, please contact night-coverage at [email or web address], password [facility] 04/28/2013, 9:59 AM  LOS: 2 days

## 2013-04-29 LAB — CBC
HEMATOCRIT: 29.9 % — AB (ref 36.0–46.0)
HEMOGLOBIN: 9.7 g/dL — AB (ref 12.0–15.0)
MCH: 28 pg (ref 26.0–34.0)
MCHC: 32.4 g/dL (ref 30.0–36.0)
MCV: 86.4 fL (ref 78.0–100.0)
Platelets: 266 10*3/uL (ref 150–400)
RBC: 3.46 MIL/uL — ABNORMAL LOW (ref 3.87–5.11)
RDW: 14.5 % (ref 11.5–15.5)
WBC: 12.1 10*3/uL — ABNORMAL HIGH (ref 4.0–10.5)

## 2013-04-29 MED ORDER — HYDROCODONE-ACETAMINOPHEN 5-325 MG PO TABS: 1.0000 | ORAL_TABLET | Freq: Four times a day (QID) | ORAL | Status: AC | PRN

## 2013-04-29 MED ORDER — LEVOFLOXACIN 750 MG PO TABS: 750.0000 mg | ORAL_TABLET | Freq: Every day | ORAL | Status: AC

## 2013-04-29 NOTE — Discharge Summary (Signed)
Physician Discharge Summary  Cynthia Gross WUJ:[id number] DOB: 05/18/1995 DOA: 04/26/2013  PCP: No primary provider on file.  Admit date: 04/26/2013 Discharge date: 04/29/2013  Time spent: >35 minutes  Recommendations for Outpatient Follow-up:  F/u with PCP in 1-2 weeks  Discharge Diagnoses:  Principal Problem:   Acute pyelonephritis Active Problems:   Nausea and vomiting   Hypokalemia   Hyperglycemia   Discharge Condition: stable   Diet recommendation: regular   Filed Weights   04/26/13 1603  Weight: 60.238 kg (132 lb 12.8 oz)    History of present illness:  Principal Problem:  Acute pyelonephritis  Active Problems:  Nausea and vomiting  Hypokalemia  Hyperglycemia   18 y.o. female with a history of seasonal allergies and GERD, who presents with a complaint of nausea, vomiting, and lower abdominal pain found to have UTI, pyelonephritis   Hospital Course: 1. UTI, pyelonephritis; afebrile  -improved on atx, urine culture NGTD; afebrile, leukocytosis resolving; d/c on PO atx, to f/u with PCP in 1-2 weeks  2. Leukocytosis due to above;  3. Nausea, vomiting likely due to pyelonephritis  -resolved; abd exam unremarkable; CT abd no acute findings, except pyelonephritis;   D/w patient, her mother regarding atx side effects, strongly recommended to avoid pregnancy during the treatment course; they understood and agreed;    Procedures:  nonen (i.e. Studies not automatically included, echos, thoracentesis, etc; not x-rays)  Consultations:  None   Discharge Exam: Filed Vitals:   04/29/13 0413  BP: 124/78  Pulse: 100  Temp: 98 F (36.7 C)  Resp: 18    General: alert Cardiovascular: s1,s2 rrr Respiratory: CTA BL  Discharge Instructions  Discharge Orders   Future Orders Complete By Expires   Diet - low sodium heart healthy  As directed    Discharge instructions  As directed    Comments:     Please follow up with primary care doctor in 1-2 weeks   Increase  activity slowly  As directed        Medication List    STOP taking these medications       naproxen 250 MG tablet  Commonly known as:  NAPROSYN      TAKE these medications       acetaminophen 325 MG tablet  Commonly known as:  TYLENOL  Take 650 mg by mouth every 6 (six) hours as needed (pain).     aspirin-acetaminophen-caffeine 250-250-65 MG per tablet  Commonly known as:  EXCEDRIN MIGRAINE  Take 1-2 tablets by mouth every 6 (six) hours as needed for headache (pain).     cetirizine 10 MG tablet  Commonly known as:  ZYRTEC  Take 10 mg by mouth daily.     HYDROcodone-acetaminophen 5-325 MG per tablet  Commonly known as:  NORCO/VICODIN  Take 1 tablet by mouth every 6 (six) hours as needed for moderate pain.     ibuprofen 200 MG tablet  Commonly known as:  ADVIL,MOTRIN  Take 400 mg by mouth every 6 (six) hours as needed (pain).     levofloxacin 750 MG tablet  Commonly known as:  LEVAQUIN  Take 1 tablet (750 mg total) by mouth daily.     SPRINTEC 28 0.25-35 MG-MCG tablet  Generic drug:  norgestimate-ethinyl estradiol  Take 1 tablet by mouth daily.       Allergies  Allergen Reactions  . Oxycodone Itching       Follow-up Information   Follow up with [facility]     In  2 weeks.   Contact information:   [address] [place] [address] [phone]       The results of significant diagnostics from this hospitalization (including imaging, microbiology, ancillary and laboratory) are listed below for reference.    Significant Diagnostic Studies: Ct Abdomen Pelvis W Contrast  04/26/2013   CLINICAL DATA:  Mid abdominal/left flank pain, leukocytosis  EXAM: CT ABDOMEN AND PELVIS WITH CONTRAST  TECHNIQUE: Multidetector CT imaging of the abdomen and pelvis was performed using the standard protocol following bolus administration of intravenous contrast.  CONTRAST:  OMNIPAQUE IOHEXOL 300 MG/ML  SOLN  COMPARISON:  None.   FINDINGS: Lung bases are clear.  Liver, spleen, pancreas, and adrenal glands are within normal limits.  Gallbladder is unremarkable. No intrahepatic or extrahepatic ductal dilatation.  Heterogeneous perfusion of the left kidney (series 2/ image 23), suggesting pyelonephritis. Right kidney is within normal limits. No hydronephrosis.  No evidence of bowel obstruction. Appendix is not discretely visualized but there are no right lower quadrant inflammatory changes to suggest acute appendicitis.  No evidence of abdominal aortic aneurysm.  No abdominopelvic ascites.  No suspicious abdominopelvic lymphadenopathy.  Uterus and left ovary are within normal limits. No right adnexal mass.  Thick-walled bladder (series 2/ image 68).  Visualized osseous structures are within normal limits.  IMPRESSION: Thick-walled bladder, correlate for cystitis.  Associated heterogeneous perfusion of the left kidney, suggesting pyelonephritis.   Electronically Signed   By: Charline Bills M.D.   On: 04/26/2013 19:42    Microbiology: Recent Results (from the past 240 hour(s))  URINE CULTURE     Status: None   Collection Time    04/27/13  7:24 PM      Result Value Ref Range Status   Specimen Description URINE, CLEAN CATCH   Final   Special Requests NONE   Final   Culture  Setup Time     Final   Value: 04/28/2013 01:15     Performed at [facility] Count     Final   Value: NO GROWTH     Performed at [facility]   Culture     Final   Value: NO GROWTH     Performed at [facility]   Report Status 04/28/2013 FINAL   Final     Labs: Basic Metabolic Panel:  Recent Labs Lab 04/26/13 1630 04/27/13 0420 04/28/13 0415  NA 134* 131* 134*  K 3.5* 3.7 4.2  CL 99 99 103  CO2 21 21 21   GLUCOSE 127* 116* 123*  BUN 16 9 7   CREATININE 1.03 0.87 0.80  CALCIUM 9.7 8.6 8.7   Liver Function Tests:  Recent Labs Lab 04/26/13 1630  AST 12  ALT 12  ALKPHOS 65  BILITOT 0.7  PROT 7.6   ALBUMIN 3.5    Recent Labs Lab 04/26/13 1630  LIPASE 12   No results found for this basename: AMMONIA,  in the last 168 hours CBC:  Recent Labs Lab 04/26/13 1630 04/27/13 0420 04/28/13 0415 04/29/13 0548  WBC 31.1* 26.5* 26.0* 12.1*  NEUTROABS 25.8*  --   --   --   HGB 12.0 10.7* 10.5* 9.7*  HCT 36.0 33.2* 31.7* 29.9*  MCV 86.3 87.8 86.8 86.4  PLT 261 232 278 266   Cardiac Enzymes: No results found for this basename: CKTOTAL, CKMB, CKMBINDEX, TROPONINI,  in the last 168 hours BNP: BNP (last 3 results) No results found for this basename: PROBNP,  in the  last 8760 hours CBG: No results found for this basename: GLUCAP,  in the last 168 hours     Signed:  Esperanza Sheets  [facility] 04/29/2013, 7:45 AM

## 2013-05-08 NOTE — ED Provider Notes (Signed)
Medical screening examination/treatment/procedure(s) were conducted as a shared visit with non-physician practitioner(s) and myself.  I personally evaluated the patient during the encounter.   EKG Interpretation None      Pt with right sided abdominal pain, nv. Mod tenderness on exam. Labs. ua pos. u cx. Ivf, rocephin.  Suzi RootsKevin E Raunak Antuna, MD 05/08/13 [phone]

## 2015-09-25 IMAGING — CT CT ABD-PELV W/ CM
1 of 3 series · 15 of 32 positions shown, 19 images · IV contrast (OMNIPAQUE 300)
Comparison: None.

CLINICAL DATA: Mid abdominal/left flank pain, leukocytosis

EXAM:
CT ABDOMEN AND PELVIS WITH CONTRAST
TECHNIQUE: Multidetector CT imaging of the abdomen and pelvis was performed
using the standard protocol following bolus administration of
intravenous contrast.
CONTRAST:  100mL OMNIPAQUE IOHEXOL 300 MG/ML  SOLN

[Series 2: abd/pel with · axial · 0.74mm/px · z∈[+810,+1185]mm · 15 of 83 slices shown, 19 images]
[im 4/83  soft-tissue]
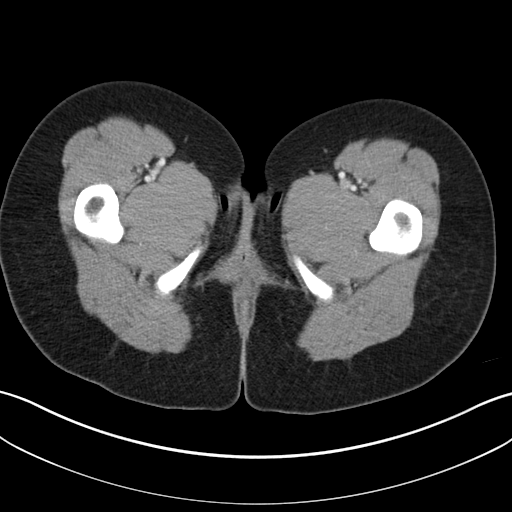
[im 4/83  bone]
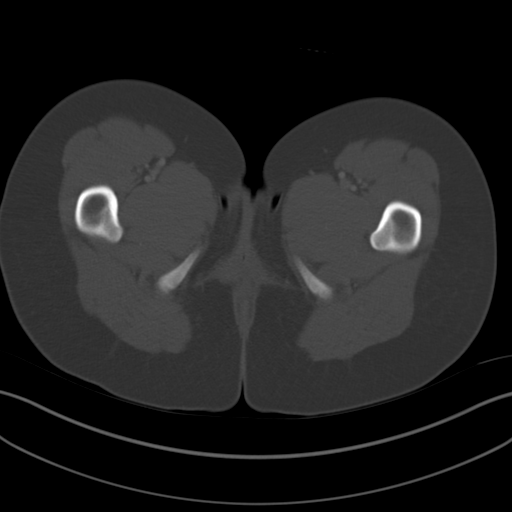
[im 12/83  soft-tissue]
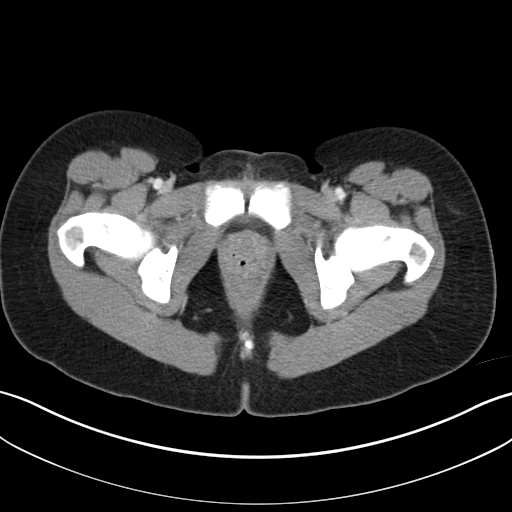
[im 19/83  soft-tissue]
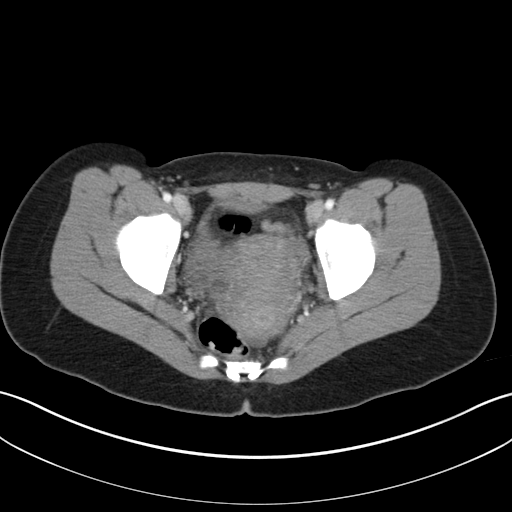
[im 23/83  soft-tissue]
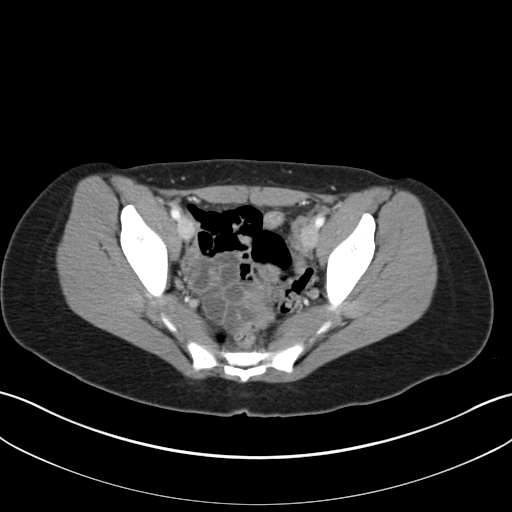
[im 30/83  soft-tissue]
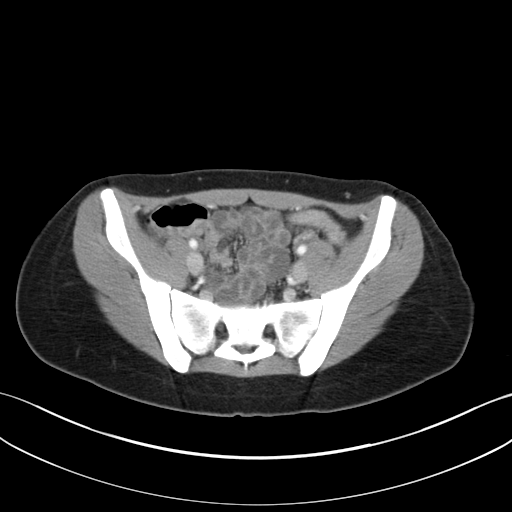
[im 34/83  soft-tissue]
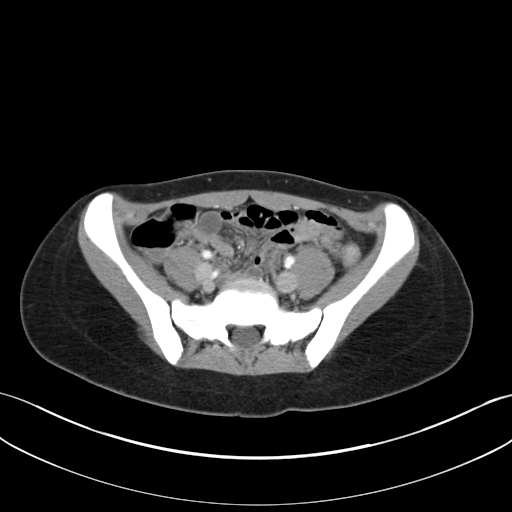
[im 42/83  soft-tissue]
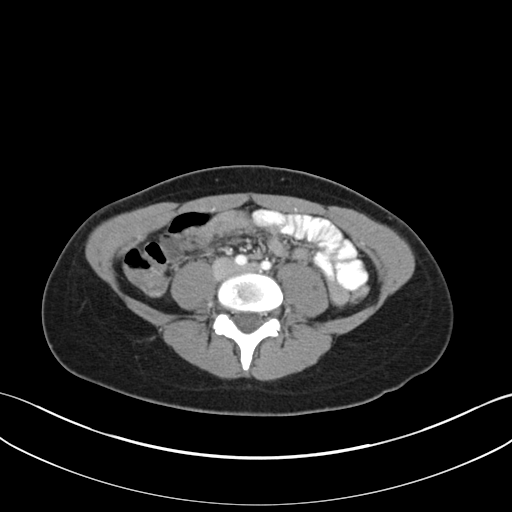
[im 49/83  soft-tissue]
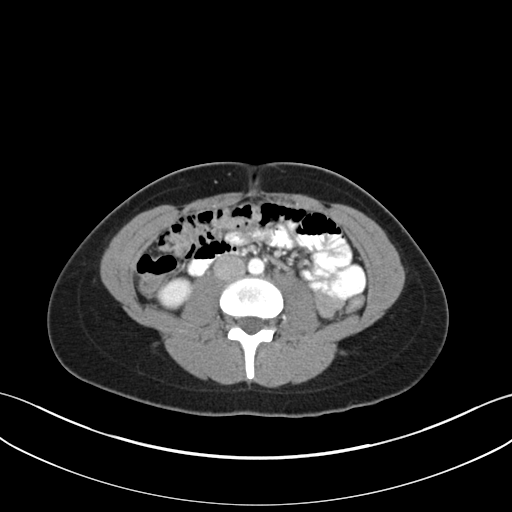
[im 53/83  soft-tissue]
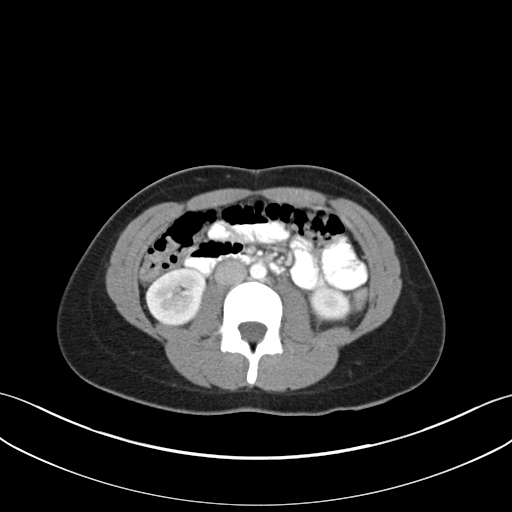
[im 53/83  bone]
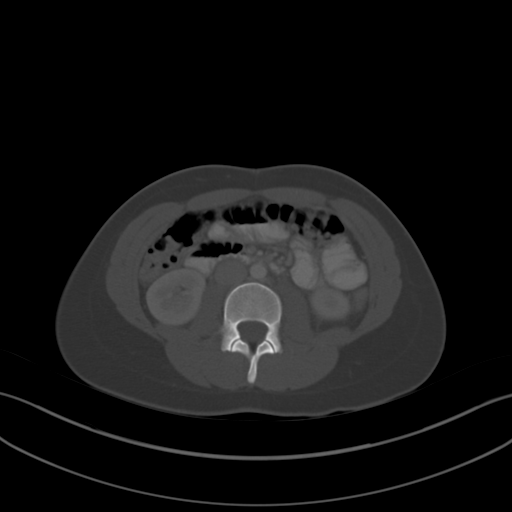
[im 60/83  soft-tissue]
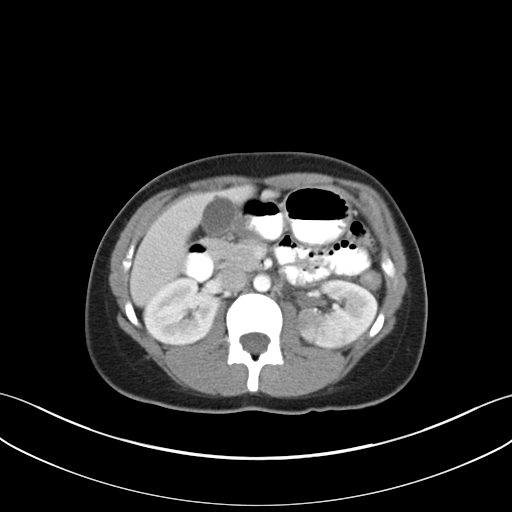
[im 64/83  soft-tissue]
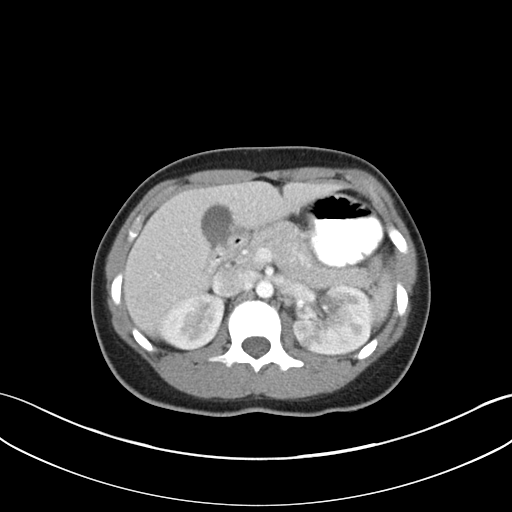
[im 68/83  lung]
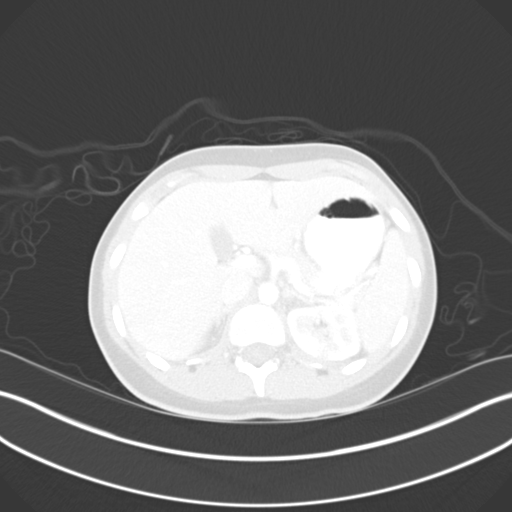
[im 71/83  soft-tissue]
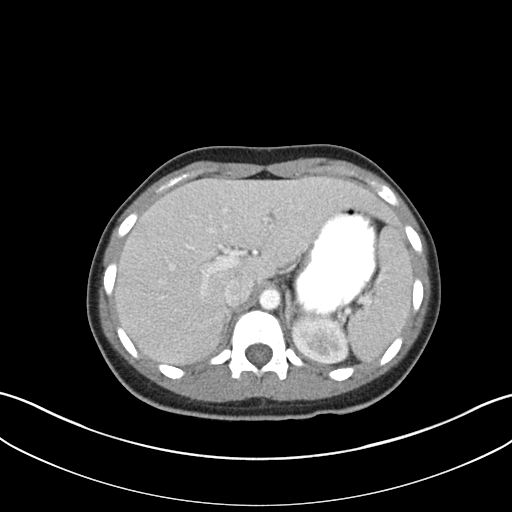
[im 71/83  lung]
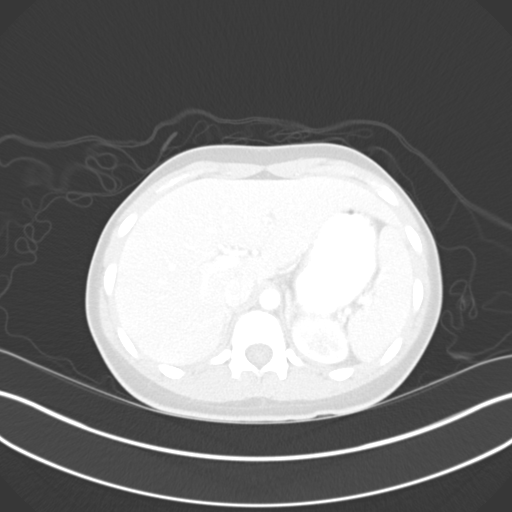
[im 75/83  lung]
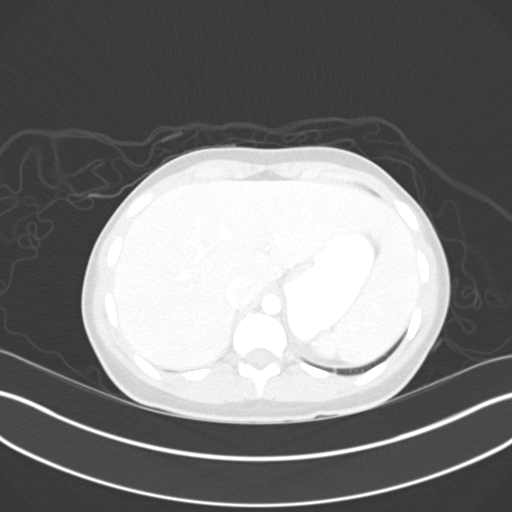
[im 79/83  soft-tissue]
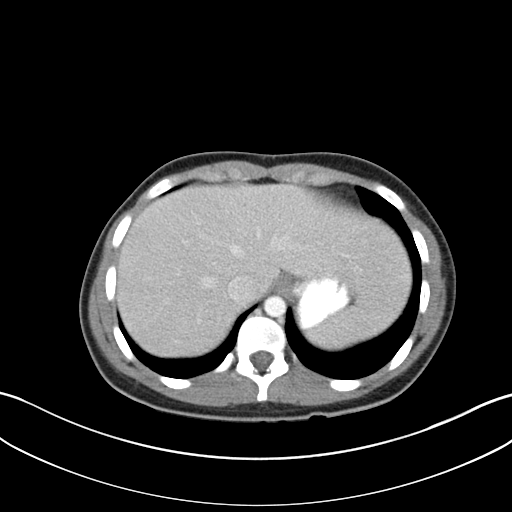
[im 79/83  lung]
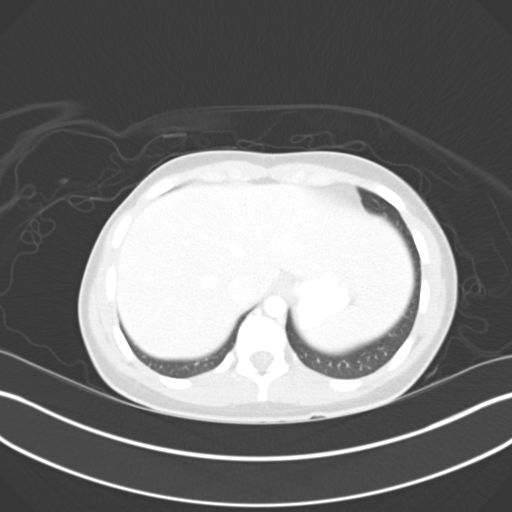

[15 of 32 positions shown; findings below may reference images not displayed]

FINDINGS: Lung bases are clear.

Liver, spleen, pancreas, and adrenal glands are within normal
limits.

Gallbladder is unremarkable. No intrahepatic or extrahepatic ductal
dilatation.

Heterogeneous perfusion of the left kidney (series 2/ image 23),
suggesting pyelonephritis. Right kidney is within normal limits. No
hydronephrosis.

No evidence of bowel obstruction. Appendix is not discretely
visualized but there are no right lower quadrant inflammatory
changes to suggest acute appendicitis.

No evidence of abdominal aortic aneurysm.

No abdominopelvic ascites.

No suspicious abdominopelvic lymphadenopathy.

Uterus and left ovary are within normal limits. No right adnexal
mass.

Thick-walled bladder (series 2/ image 68).

Visualized osseous structures are within normal limits.
IMPRESSION: Thick-walled bladder, correlate for cystitis.

Associated heterogeneous perfusion of the left kidney, suggesting
pyelonephritis.
